# Patient Record
Sex: Female | Born: 1965 | Race: White | Hispanic: No | Smoking: Former smoker
Health system: Southern US, Community
[De-identification: ages and names within clinical notes are randomized; demographics above are authoritative.]

## PROBLEM LIST (undated history)

## (undated) DIAGNOSIS — F32A Depression, unspecified: Secondary | ICD-10-CM

## (undated) DIAGNOSIS — K219 Gastro-esophageal reflux disease without esophagitis: Secondary | ICD-10-CM

## (undated) DIAGNOSIS — K811 Chronic cholecystitis: Secondary | ICD-10-CM

## (undated) DIAGNOSIS — R51 Headache: Secondary | ICD-10-CM

## (undated) DIAGNOSIS — R519 Headache, unspecified: Secondary | ICD-10-CM

## (undated) DIAGNOSIS — F329 Major depressive disorder, single episode, unspecified: Secondary | ICD-10-CM

## (undated) HISTORY — DX: Chronic cholecystitis: K81.1

## (undated) HISTORY — PX: OTHER SURGICAL HISTORY: SHX169

---

## 2011-12-02 DIAGNOSIS — E669 Obesity, unspecified: Secondary | ICD-10-CM | POA: Insufficient documentation

## 2012-05-09 ENCOUNTER — Ambulatory Visit: Payer: Self-pay | Admitting: Family Medicine

## 2012-07-11 DIAGNOSIS — N926 Irregular menstruation, unspecified: Secondary | ICD-10-CM | POA: Insufficient documentation

## 2018-01-23 ENCOUNTER — Emergency Department
Admission: EM | Admit: 2018-01-23 | Discharge: 2018-01-23 | Disposition: A | Discharge status: Home / Self Care | Payer: 59 | Attending: Emergency Medicine | Admitting: Emergency Medicine

## 2018-01-23 ENCOUNTER — Encounter: Payer: Self-pay | Admitting: Emergency Medicine

## 2018-01-23 ENCOUNTER — Emergency Department: Payer: 59

## 2018-01-23 ENCOUNTER — Other Ambulatory Visit: Payer: Self-pay

## 2018-01-23 DIAGNOSIS — K8020 Calculus of gallbladder without cholecystitis without obstruction: Secondary | ICD-10-CM | POA: Diagnosis not present

## 2018-01-23 DIAGNOSIS — R1011 Right upper quadrant pain: Secondary | ICD-10-CM | POA: Insufficient documentation

## 2018-01-23 DIAGNOSIS — R079 Chest pain, unspecified: Secondary | ICD-10-CM | POA: Diagnosis present

## 2018-01-23 DIAGNOSIS — M546 Pain in thoracic spine: Secondary | ICD-10-CM | POA: Diagnosis not present

## 2018-01-23 DIAGNOSIS — K8050 Calculus of bile duct without cholangitis or cholecystitis without obstruction: Secondary | ICD-10-CM

## 2018-01-23 DIAGNOSIS — R52 Pain, unspecified: Secondary | ICD-10-CM

## 2018-01-23 HISTORY — DX: Major depressive disorder, single episode, unspecified: F32.9

## 2018-01-23 HISTORY — DX: Depression, unspecified: F32.A

## 2018-01-23 LAB — URINALYSIS, COMPLETE (UACMP) WITH MICROSCOPIC
BILIRUBIN URINE: NEGATIVE
Glucose, UA: NEGATIVE mg/dL
HGB URINE DIPSTICK: NEGATIVE
Ketones, ur: NEGATIVE mg/dL
LEUKOCYTES UA: NEGATIVE
NITRITE: NEGATIVE
Protein, ur: NEGATIVE mg/dL
Specific Gravity, Urine: 1.026 (ref 1.005–1.030)
pH: 7 (ref 5.0–8.0)

## 2018-01-23 LAB — BASIC METABOLIC PANEL
Anion gap: 9 (ref 5–15)
BUN: 15 mg/dL (ref 6–20)
CHLORIDE: 105 mmol/L (ref 101–111)
CO2: 25 mmol/L (ref 22–32)
Calcium: 9 mg/dL (ref 8.9–10.3)
Creatinine, Ser: 0.73 mg/dL (ref 0.44–1.00)
Glucose, Bld: 119 mg/dL — ABNORMAL HIGH (ref 65–99)
POTASSIUM: 4.3 mmol/L (ref 3.5–5.1)
SODIUM: 139 mmol/L (ref 135–145)

## 2018-01-23 LAB — CBC
HEMATOCRIT: 38.5 % (ref 35.0–47.0)
Hemoglobin: 13.2 g/dL (ref 12.0–16.0)
MCH: 31.7 pg (ref 26.0–34.0)
MCHC: 34.3 g/dL (ref 32.0–36.0)
MCV: 92.6 fL (ref 80.0–100.0)
PLATELETS: 330 10*3/uL (ref 150–440)
RBC: 4.16 MIL/uL (ref 3.80–5.20)
RDW: 12.5 % (ref 11.5–14.5)
WBC: 9.9 10*3/uL (ref 3.6–11.0)

## 2018-01-23 LAB — HEPATIC FUNCTION PANEL
ALT: 28 U/L (ref 14–54)
AST: 28 U/L (ref 15–41)
Albumin: 4.1 g/dL (ref 3.5–5.0)
Alkaline Phosphatase: 78 U/L (ref 38–126)
Total Bilirubin: 0.4 mg/dL (ref 0.3–1.2)
Total Protein: 7.6 g/dL (ref 6.5–8.1)

## 2018-01-23 LAB — TROPONIN I: Troponin I: <0.03 ng/mL (ref <0.03)

## 2018-01-23 LAB — LIPASE, BLOOD: Lipase: 39 U/L (ref 11–51)

## 2018-01-23 MED ORDER — IBUPROFEN 600 MG PO TABS: 600.0000 mg | ORAL_TABLET | Freq: Three times a day (TID) | ORAL | 0 refills | Status: DC | PRN

## 2018-01-23 MED ORDER — KETOROLAC TROMETHAMINE 30 MG/ML IJ SOLN
15.0000 mg | Freq: Once | INTRAMUSCULAR | Status: AC
Start: 2018-01-23 — End: 2018-01-23
  Administered 2018-01-23: 15 mg via INTRAVENOUS
  Filled 2018-01-23: qty 1

## 2018-01-23 MED ORDER — FENTANYL CITRATE (PF) 100 MCG/2ML IJ SOLN
50.0000 ug | Freq: Once | INTRAMUSCULAR | Status: AC
  Administered 2018-01-23: 50 ug via INTRAVENOUS
  Filled 2018-01-23: qty 2

## 2018-01-23 MED ORDER — HYDROCODONE-ACETAMINOPHEN 5-325 MG PO TABS: 1.0000 | ORAL_TABLET | Freq: Four times a day (QID) | ORAL | 0 refills | Status: DC | PRN

## 2018-01-23 MED ORDER — ONDANSETRON HCL 4 MG/2ML IJ SOLN
4.0000 mg | Freq: Once | INTRAMUSCULAR | Status: AC
Start: 2018-01-23 — End: 2018-01-23
  Administered 2018-01-23: 4 mg via INTRAVENOUS
  Filled 2018-01-23: qty 2

## 2018-01-23 MED ORDER — IOHEXOL 350 MG/ML SOLN
75.0000 mL | Freq: Once | INTRAVENOUS | Status: AC | PRN
  Administered 2018-01-23: 75 mL via INTRAVENOUS

## 2018-01-23 MED ORDER — ONDANSETRON 4 MG PO TBDP: 4.0000 mg | ORAL_TABLET | Freq: Three times a day (TID) | ORAL | 0 refills | Status: DC | PRN

## 2018-01-23 NOTE — ED Notes (Signed)
Patient transported to CT 

## 2018-01-23 NOTE — ED Triage Notes (Signed)
Pt arrives POV and ambulatory to triage with c/o chest pain that "shoots through" to her back. Pt reports that the pain started around 2300. Pt is in NAD.

## 2018-01-23 NOTE — ED Notes (Signed)
Patient transported to Ultrasound 

## 2018-01-23 NOTE — Discharge Instructions (Signed)
Please take your pain medication as needed for severe symptoms and make an appointment to follow-up with the general surgeon for reevaluation.  Return to the emergency department sooner for any concerns whatsoever.

## 2018-01-23 NOTE — ED Provider Notes (Signed)
[facility] Emergency Department Provider Note  ____________________________________________   First MD Initiated Contact with Patient 01/23/18 0510     (approximate)  I have reviewed the triage vital signs and the nursing notes.   HISTORY  Chief Complaint Chest Pain   HPI Norma Anderson is a 52 y.o. female who comes to the emergency department with right upper back pain wrapping around to her right upper quadrant and right lower chest that began last night around 11 PM.  She was getting ready for bed when it came on gradually and has been completely constant ever since.  Associated with nausea and she has vomited several times.  It is nonexertional.  Non-positional.  She did eat a quesadilla shortly before going to bed.  No history of similar pain.  No history of coronary artery disease.  She was recently diagnosed with a left ovarian "mass" and is scheduled for surgical resection.  She carries no known diagnosis of cancer.  Nothing seems to make the pain better or worse.  It is currently moderate in severity and associated with nausea.    Past Medical History:  Diagnosis Date  . Depression     There are no active problems to display for this patient.   Past Surgical History:  Procedure Laterality Date  . CESAREAN SECTION      Prior to Admission medications   Medication Sig Start Date End Date Taking? Authorizing Provider  HYDROcodone-acetaminophen (NORCO) 5-325 MG tablet Take 1 tablet by mouth every 6 (six) hours as needed for up to 7 doses for severe pain. 01/23/18   Merrily Brittle, MD  ibuprofen (ADVIL,MOTRIN) 600 MG tablet Take 1 tablet (600 mg total) by mouth every 8 (eight) hours as needed. 01/23/18   Merrily Brittle, MD  ondansetron (ZOFRAN ODT) 4 MG disintegrating tablet Take 1 tablet (4 mg total) by mouth every 8 (eight) hours as needed for nausea or vomiting. 01/23/18   Merrily Brittle, MD    Allergies Patient has no known allergies.  No  family history on file.  Social History Social History   Tobacco Use  . Smoking status: Never Smoker  . Smokeless tobacco: Never Used  Substance Use Topics  . Alcohol use: Never    Frequency: Never  . Drug use: Never    Review of Systems Constitutional: No fever/chills Eyes: No visual changes. ENT: No sore throat. Cardiovascular: Positive for chest pain. Respiratory: Denies shortness of breath. Gastrointestinal: Positive for abdominal pain.  Positive for nausea, positive for vomiting.  No diarrhea.  No constipation. Genitourinary: Negative for dysuria. Musculoskeletal: Negative for back pain. Skin: Negative for rash. Neurological: Negative for headaches, focal weakness or numbness.   ____________________________________________   PHYSICAL EXAM:  VITAL SIGNS: ED Triage Vitals  Enc Vitals Group     BP 01/23/18 0428 (!) 174/84     Pulse Rate 01/23/18 0428 71     Resp 01/23/18 0428 16     Temp 01/23/18 0428 98.5 F (36.9 C)     Temp Source 01/23/18 0428 Oral     SpO2 01/23/18 0428 97 %     Weight 01/23/18 0425 295 lb (133.8 kg)     Height 01/23/18 0425 5\' 6"  (1.676 m)     Head Circumference --      Peak Flow --      Pain Score --      Pain Loc --      Pain Edu? --      Excl. in GC? --  Constitutional: Alert and oriented x4 pleasant cooperative speaks full clear sentences no diaphoresis Eyes: PERRL EOMI. Head: Atraumatic. Nose: No congestion/rhinnorhea. Mouth/Throat: No trismus Neck: No stridor.   Cardiovascular: Normal rate, regular rhythm. Grossly normal heart sounds.  Good peripheral circulation. Respiratory: Normal respiratory effort.  No retractions. Lungs CTAB and moving good air Gastrointestinal: Morbidly obese soft nondistended nontender no rebound or guarding no peritonitis no McBurney's tenderness negative Rovsing's negative Murphy's Musculoskeletal: No lower extremity edema   Neurologic:  Normal speech and language. No gross focal neurologic  deficits are appreciated. Skin:  Skin is warm, dry and intact. No rash noted. Psychiatric: Mood and affect are normal. Speech and behavior are normal.    ____________________________________________   DIFFERENTIAL includes but not limited to  Aortic dissection, pulmonary embolism, acute coronary syndrome, biliary colic, cholecystitis ____________________________________________   LABS (all labs ordered are listed, but only abnormal results are displayed)  Labs Reviewed  BASIC METABOLIC PANEL - Abnormal; Notable for the following components:      Result Value   Glucose, Bld 119 (*)    All other components within normal limits  URINALYSIS, COMPLETE (UACMP) WITH MICROSCOPIC - Abnormal; Notable for the following components:   Color, Urine YELLOW (*)    APPearance CLEAR (*)    Bacteria, UA RARE (*)    All other components within normal limits  HEPATIC FUNCTION PANEL - Abnormal; Notable for the following components:   Bilirubin, Direct <0.1 (*)    All other components within normal limits  CBC  TROPONIN I  LIPASE, BLOOD  POC URINE PREG, ED    Lab work reviewed by me with no signs of acute ischemia No signs of biliary obstruction __________________________________________  EKG  ED ECG REPORT I, Merrily BrittleNeil Toryn Mcclinton, the attending physician, personally viewed and interpreted this ECG.  Date: 01/23/2018 EKG Time:  Rate: 68 Rhythm: normal sinus rhythm QRS Axis: Leftward axis Intervals: normal ST/T Wave abnormalities: normal Narrative Interpretation: no evidence of acute ischemia  ____________________________________________  RADIOLOGY  Chest x-ray reviewed by me with no acute disease CT angios reviewed by me with no clot but does show gallstones ___Ultrasound of the right upper quadrant reviewed by me with no evidence of biliary dilation or cholecystitis _________________________________________   PROCEDURES  Procedure(s) performed: no  Procedures  Critical Care  performed: no  ____________________________________________   INITIAL IMPRESSION / ASSESSMENT AND PLAN / ED COURSE  Pertinent labs & imaging results that were available during my care of the patient were reviewed by me and considered in my medical decision making (see chart for details).       ----------------------------------------- 5:53 AM on 01/23/2018 -----------------------------------------  The patient's CT angiogram reviewed by me shows no obvious clot however I do see 3 very large gallstones which could be consistent with the patient's symptoms.  50 mcg of IV fentanyl, 15 mg of IV Toradol, and 4 mg of IV Zofran for pain control now along with a right upper quadrant ultrasound are all pending.  Clinically she has biliary colic and not cholecystitis.  Also added on LFTs and lipase.  ____________________________________________  ----------------------------------------- 6:39 AM on 01/23/2018 -----------------------------------------  Fortunately the patient's LFTs and lipase are reassuring and her right upper quadrant ultrasound has no evidence of cholecystitis.  Following Toradol and fentanyl her pain is now down to a 0.  I will make an outpatient referral to general surgery and treat her symptomatically with a short course of ibuprofen, Zofran, and Vicodin.  Strict return precautions have been given and  the patient verbalizes understanding and agreement with plan.  FINAL CLINICAL IMPRESSION(S) / ED DIAGNOSES  Final diagnoses:  Pain  Biliary colic      NEW MEDICATIONS STARTED DURING THIS VISIT:  New Prescriptions   HYDROCODONE-ACETAMINOPHEN (NORCO) 5-325 MG TABLET    Take 1 tablet by mouth every 6 (six) hours as needed for up to 7 doses for severe pain.   IBUPROFEN (ADVIL,MOTRIN) 600 MG TABLET    Take 1 tablet (600 mg total) by mouth every 8 (eight) hours as needed.   ONDANSETRON (ZOFRAN ODT) 4 MG DISINTEGRATING TABLET    Take 1 tablet (4 mg total) by mouth every 8  (eight) hours as needed for nausea or vomiting.     Note:  This document was prepared using Dragon voice recognition software and may include unintentional dictation errors.     Merrily Brittle, MD 01/23/18 [phone]

## 2018-02-08 DIAGNOSIS — N838 Other noninflammatory disorders of ovary, fallopian tube and broad ligament: Secondary | ICD-10-CM | POA: Insufficient documentation

## 2018-02-09 ENCOUNTER — Ambulatory Visit: Payer: Managed Care, Other (non HMO) | Admitting: Surgery

## 2018-02-09 ENCOUNTER — Encounter: Payer: Self-pay | Admitting: Surgery

## 2018-02-09 VITALS — BP 136/74 | HR 74 | Temp 98.1°F | Ht 66.0 in | Wt 285.0 lb

## 2018-02-09 DIAGNOSIS — K8020 Calculus of gallbladder without cholecystitis without obstruction: Secondary | ICD-10-CM | POA: Insufficient documentation

## 2018-02-09 DIAGNOSIS — G43909 Migraine, unspecified, not intractable, without status migrainosus: Secondary | ICD-10-CM | POA: Insufficient documentation

## 2018-02-09 DIAGNOSIS — F341 Dysthymic disorder: Secondary | ICD-10-CM | POA: Insufficient documentation

## 2018-02-09 NOTE — Patient Instructions (Signed)
You have requested to have your gallbladder removed. This will be done at [facility] with  Dr. Earlene Plateravis.  We will call you with the date.   You will most likely be out of work 1-2 weeks for this surgery. You will return after your post-op appointment with a lifting restriction for approximately 4 more weeks.  You will be able to eat anything you would like to following surgery. But, start by eating a bland diet and advance this as tolerated. The Gallbladder diet is below, please go as closely by this diet as possible prior to surgery to avoid any further attacks.  Please see the (blue)pre-care form that you have been given today. If you have any questions, please call our office.  Laparoscopic Cholecystectomy Laparoscopic cholecystectomy is surgery to remove the gallbladder. The gallbladder is located in the upper right part of the abdomen, behind the liver. It is a storage sac for bile, which is produced in the liver. Bile aids in the digestion and absorption of fats. Cholecystectomy is often done for inflammation of the gallbladder (cholecystitis). This condition is usually caused by a buildup of gallstones (cholelithiasis) in the gallbladder. Gallstones can block the flow of bile, and that can result in inflammation and pain. In severe cases, emergency surgery may be required. If emergency surgery is not required, you will have time to prepare for the procedure. Laparoscopic surgery is an alternative to open surgery. Laparoscopic surgery has a shorter recovery time. Your common bile duct may also need to be examined during the procedure. If stones are found in the common bile duct, they may be removed. LET [facility]OUR [facility]ARE PROVIDER KNOW ABOUT:  Any allergies you have.  All medicines you are taking, including vitamins, herbs, eye drops, creams, and over-the-counter medicines.  Previous problems you or members of your family have had with the use of anesthetics.  Any blood  disorders you have.  Previous surgeries you have had.    Any medical conditions you have. RISKS AND COMPLICATIONS Generally, this is a safe procedure. However, problems may occur, including:  Infection.  Bleeding.  Allergic reactions to medicines.  Damage to other structures or organs.  A stone remaining in the common bile duct.  A bile leak from the cyst duct that is clipped when your gallbladder is removed.  The need to convert to open surgery, which requires a larger incision in the abdomen. This may be necessary if your surgeon thinks that it is not safe to continue with a laparoscopic procedure. BEFORE THE PROCEDURE  Ask your health care provider about:  Changing or stopping your regular medicines. This is especially important if you are taking diabetes medicines or blood thinners.  Taking medicines such as aspirin and ibuprofen. These medicines can thin your blood. Do not take these medicines before your procedure if your health care provider instructs you not to.  Follow instructions from your health care provider about eating or drinking restrictions.  Let your health care provider know if you develop a cold or an infection before surgery.  Plan to have someone take you home after the procedure.  Ask your health care provider how your surgical site will be marked or identified.  You may be given antibiotic medicine to help prevent infection. PROCEDURE  To reduce your risk of infection:  Your health care team will wash or sanitize their hands.  Your skin will be washed with soap.  An IV tube may be inserted into one of your  veins.  You will be given a medicine to make you fall asleep (general anesthetic).  A breathing tube will be placed in your mouth.  The surgeon will make several small cuts (incisions) in your abdomen.  A thin, lighted tube (laparoscope) that has a tiny camera on the end will be inserted through one of the small incisions. The camera  on the laparoscope will send a picture to a TV screen (monitor) in the operating room. This will give the surgeon a good view inside your abdomen.  A gas will be pumped into your abdomen. This will expand your abdomen to give the surgeon more room to perform the surgery.  Other tools that are needed for the procedure will be inserted through the other incisions. The gallbladder will be removed through one of the incisions.  After your gallbladder has been removed, the incisions will be closed with stitches (sutures), staples, or skin glue.  Your incisions may be covered with a bandage (dressing). The procedure may vary among health care providers and hospitals. AFTER THE PROCEDURE  Your blood pressure, heart rate, breathing rate, and blood oxygen level will be monitored often until the medicines you were given have worn off.  You will be given medicines as needed to control your pain.   This information is not intended to replace advice given to you by your health care provider. Make sure you discuss any questions you have with your health care provider.   Document Released: 07/19/2005 Document Revised: 04/09/2015 Document Reviewed: 02/28/2013 Elsevier Interactive Patient Education 2016 [place] Diet for Gallbladder Conditions A low-fat diet can be helpful if you have pancreatitis or a gallbladder condition. With these conditions, your pancreas and gallbladder have trouble digesting fats. A healthy eating plan with less fat will help rest your pancreas and gallbladder and reduce your symptoms. WHAT DO I NEED TO KNOW ABOUT THIS DIET?  Eat a low-fat diet.  Reduce your fat intake to less than 20-30% of your total daily calories. This is less than 50-60 g of fat per day.  Remember that you need some fat in your diet. Ask your dietician what your daily goal should be.  Choose nonfat and low-fat healthy foods. Look for the words "nonfat," "low fat," or "fat free."  As a  guide, look on the label and choose foods with less than 3 g of fat per serving. Eat only one serving.  Avoid alcohol.  Do not smoke. If you need help quitting, talk with your health care provider.  Eat small frequent meals instead of three large heavy meals. WHAT FOODS CAN I EAT? Grains Include healthy grains and starches such as potatoes, wheat bread, fiber-rich cereal, and brown rice. Choose whole grain options whenever possible. In adults, whole grains should account for 45-65% of your daily calories.  Fruits and Vegetables Eat plenty of fruits and vegetables. Fresh fruits and vegetables add fiber to your diet. Meats and Other Protein Sources Eat lean meat such as chicken and pork. Trim any fat off of meat before cooking it. Eggs, fish, and beans are other sources of protein. In adults, these foods should account for 10-35% of your daily calories. Dairy Choose low-fat milk and dairy options. Dairy includes fat and protein, as well as calcium.  Fats and Oils Limit high-fat foods such as fried foods, sweets, baked goods, sugary drinks.  Other Creamy sauces and condiments, such as mayonnaise, can add extra fat. Think about whether or not you need to  use them, or use smaller amounts or low fat options. WHAT FOODS ARE NOT RECOMMENDED?  High fat foods, such as:  [employer].  Ice cream.  [place] toast.  Sweet rolls.  Pizza.  Cheese bread.  Foods covered with batter, butter, creamy sauces, or cheese.  Fried foods.  Sugary drinks and desserts.  Foods that cause gas or bloating   This information is not intended to replace advice given to you by your health care provider. Make sure you discuss any questions you have with your health care provider.   Document Released: 07/24/2013 Document Reviewed: 07/24/2013 Elsevier Interactive Patient Education [employer].

## 2018-02-12 ENCOUNTER — Encounter: Payer: Self-pay | Admitting: Surgery

## 2018-02-12 NOTE — H&P (View-Only) (Signed)
Surgical Clinic History and Physical  Referring provider:  Rolm GalaGrandis, Heidi, MD [address] [place], [place] [id number]  HISTORY OF PRESENT ILLNESS (HPI):  52 y.o. female presents for evaluation of RUQ abdominal pain. Patient reports she [facility] for RUQ pain with nausea and several episodes of non-bloody emesis after eating a quesadilla for a late dinner. After RUQ abdominal ultrasound was performed, her pain resolved following a dose of Toradol and fentanyl. She has since avoided any fatty foods and denies and additional subsequent episodes of RUQ abdominal pain. She otherwise denies any fever/chills, CP, or SOB.  PAST MEDICAL HISTORY (PMH):  Past Medical History:  Diagnosis Date  . Depression      PAST SURGICAL HISTORY (PSH):  Past Surgical History:  Procedure Laterality Date  . CESAREAN SECTION       MEDICATIONS:  Prior to Admission medications   Medication Sig Start Date End Date Taking? Authorizing Provider  acetaminophen (TYLENOL) 500 MG tablet Take by mouth.    [provider]  buPROPion (WELLBUTRIN XL) 150 MG 24 hr tablet Take 150 mg by mouth daily. 01/09/18   [provider]  Diphenhydramine-APAP 12.5-325 MG/15ML LIQD Take by mouth.    [provider]  ibuprofen (ADVIL,MOTRIN) 600 MG tablet Take 1 tablet (600 mg total) by mouth every 8 (eight) hours as needed. 01/23/18   Merrily Brittleifenbark, Neil, MD  naproxen sodium (ALEVE) 220 MG tablet Reported on 11/17/2015    [provider]  Phenylephrine-Acetaminophen (VICKS SINEX DAYTIME) 5-325 MG CAPS Reported on 11/17/2015    [provider]     ALLERGIES:  No Known Allergies   SOCIAL HISTORY:  Social History   Socioeconomic History  . Marital status: Divorced    Spouse name: Not on file  . Number of children: Not on file  . Years of education: Not on file  . Highest education level: Not on file  Occupational History  . Not on file  Social Needs  . Financial  resource strain: Not on file  . Food insecurity:    Worry: Not on file    Inability: Not on file  . Transportation needs:    Medical: Not on file    Non-medical: Not on file  Tobacco Use  . Smoking status: Never Smoker  . Smokeless tobacco: Never Used  Substance and Sexual Activity  . Alcohol use: Never    Frequency: Never  . Drug use: Never  . Sexual activity: Not on file  Lifestyle  . Physical activity:    Days per week: Not on file    Minutes per session: Not on file  . Stress: Not on file  Relationships  . Social connections:    Talks on phone: Not on file    Gets together: Not on file    Attends religious service: Not on file    Active member of club or organization: Not on file    Attends meetings of clubs or organizations: Not on file    Relationship status: Not on file  . Intimate partner violence:    Fear of current or ex partner: Not on file    Emotionally abused: Not on file    Physically abused: Not on file    Forced sexual activity: Not on file  Other Topics Concern  . Not on file  Social History Narrative  . Not on file    The patient currently resides (home / rehab facility / nursing home): Home The patient  normally is (ambulatory / bedbound): Ambulatory  FAMILY HISTORY:  History reviewed. No pertinent family history.  Otherwise negative/non-contributory.  REVIEW OF SYSTEMS:  Constitutional: denies any other weight loss, fever, chills, or sweats  Eyes: denies any other vision changes, history of eye injury  ENT: denies sore throat, hearing problems  Respiratory: denies shortness of breath, wheezing  Cardiovascular: denies chest pain, palpitations  Gastrointestinal: abdominal pain, N/V, and bowel function as per HPI Musculoskeletal: denies any other joint pains or cramps  Skin: Denies any other rashes or skin discolorations Neurological: denies any other headache, dizziness, weakness  Psychiatric: Denies any other depression, anxiety   All other  review of systems were otherwise negative   VITAL SIGNS:  @VSRANGES @     Height: 5\' 6"  (167.6 cm) Weight: 285 lb (129.3 kg) BMI (Calculated): 46.02   PHYSICAL EXAM:  Constitutional:  -- Obese body habitus  -- Awake, alert, and oriented x3  Eyes:  -- Pupils equally round and reactive to light  -- No scleral icterus  Ear, nose, throat:  -- No jugular venous distension -- No nasal drainage, bleeding Pulmonary:  -- No crackles  -- Equal breath sounds bilaterally -- Breathing non-labored at rest Cardiovascular:  -- S1, S2 present  -- No pericardial rubs  Gastrointestinal:  -- Abdomen soft, nontender, non-distended, no guarding/rebound  -- No abdominal masses appreciated, pulsatile or otherwise  Musculoskeletal and Integumentary:  -- Wounds or skin discoloration: None appreciated -- Extremities: B/L UE and LE FROM, hands and feet warm  Neurologic:  -- Motor function: Intact and symmetric -- Sensation: Intact and symmetric  Labs:  CBC Latest Ref Rng & Units 01/23/2018  WBC 3.6 - 11.0 K/uL 9.9  Hemoglobin 12.0 - 16.0 g/dL 09.8  Hematocrit 11.9 - 47.0 % 38.5  Platelets 150 - 440 K/uL 330   CMP Latest Ref Rng & Units 01/23/2018  Glucose 65 - 99 mg/dL 147(W)  BUN 6 - 20 mg/dL 15  Creatinine 2.95 - 6.21 mg/dL 3.08  Sodium 657 - 846 mmol/L 139  Potassium 3.5 - 5.1 mmol/L 4.3  Chloride 101 - 111 mmol/L 105  CO2 22 - 32 mmol/L 25  Calcium 8.9 - 10.3 mg/dL 9.0  Total Protein 6.5 - 8.1 g/dL 7.6  Total Bilirubin 0.3 - 1.2 mg/dL 0.4  Alkaline Phos 38 - 126 U/L 78  AST 15 - 41 U/L 28  ALT 14 - 54 U/L 28    Imaging studies:  Limited RUQ Abdominal Ultrasound (01/23/2018) 1. Cholelithiasis with no additional sonographic features for acute cholecystitis. 2. No biliary dilatation.   Assessment/Plan:  52 y.o. female with symptomatic cholelithiasis, complicated by co-morbidities including morbid obesity (BMI >46) and major depression disorder.              - avoid/minimize foods  with higher fat content (meats, cheeses/dairy, and fried)             - prefer low-fat vegetables, whole grains (wheat bread, ceareals, etc), and fruits until cholecystectomy              - all risks, benefits, and alternatives to cholecystectomy were discussed with the patient, all of her questions were answered to her expressed satisfaction, patient expresses she wishes to proceed, and informed consent was obtained.             - will plan for laparoscopic cholecystectomy on 8/2 pending anesthesia and OR availability             - anticipate return to  clinic 2 weeks after above planned surgery             - instructed to call if any questions or concerns  All of the above recommendations were discussed with the patient, and all of patient's questions were answered to her expressed satisfaction.  Thank you for the opportunity to participate in this patient's care.  -- Scherrie Gerlach Earlene Plater, MD, RPVI [place]: [facility] General Surgery - Partnering for exceptional care. Office: [phone]

## 2018-02-12 NOTE — Progress Notes (Signed)
Surgical Clinic History and Physical  Referring provider:  Grandis, Heidi, MD [address] [place] [address] [place], [place] [address]  HISTORY OF PRESENT ILLNESS (HPI):  52 y.o. female presents for evaluation of RUQ abdominal pain. Patient reports she presented 1 week ago to [facility] ED for RUQ pain with nausea and several episodes of non-bloody emesis after eating a quesadilla for a late dinner. After RUQ abdominal ultrasound was performed, her pain resolved following a dose of Toradol and fentanyl. She has since avoided any fatty foods and denies and additional subsequent episodes of RUQ abdominal pain. She otherwise denies any fever/chills, CP, or SOB.  PAST MEDICAL HISTORY (PMH):  Past Medical History:  Diagnosis Date  . Depression      PAST SURGICAL HISTORY (PSH):  Past Surgical History:  Procedure Laterality Date  . CESAREAN SECTION       MEDICATIONS:  Prior to Admission medications   Medication Sig Start Date End Date Taking? Authorizing Provider  acetaminophen (TYLENOL) 500 MG tablet Take by mouth.    [provider]  buPROPion (WELLBUTRIN XL) 150 MG 24 hr tablet Take 150 mg by mouth daily. 01/09/18   [provider]  Diphenhydramine-APAP 12.5-325 MG/15ML LIQD Take by mouth.    [provider]  ibuprofen (ADVIL,MOTRIN) 600 MG tablet Take 1 tablet (600 mg total) by mouth every 8 (eight) hours as needed. 01/23/18   Rifenbark, Neil, MD  naproxen sodium (ALEVE) 220 MG tablet Reported on 11/17/2015    [provider]  Phenylephrine-Acetaminophen (VICKS SINEX DAYTIME) 5-325 MG CAPS Reported on 11/17/2015    [provider]     ALLERGIES:  No Known Allergies   SOCIAL HISTORY:  Social History   Socioeconomic History  . Marital status: Divorced    Spouse name: Not on file  . Number of children: Not on file  . Years of education: Not on file  . Highest education level: Not on file  Occupational History  . Not on file  Social Needs  . Financial  resource strain: Not on file  . Food insecurity:    Worry: Not on file    Inability: Not on file  . Transportation needs:    Medical: Not on file    Non-medical: Not on file  Tobacco Use  . Smoking status: Never Smoker  . Smokeless tobacco: Never Used  Substance and Sexual Activity  . Alcohol use: Never    Frequency: Never  . Drug use: Never  . Sexual activity: Not on file  Lifestyle  . Physical activity:    Days per week: Not on file    Minutes per session: Not on file  . Stress: Not on file  Relationships  . Social connections:    Talks on phone: Not on file    Gets together: Not on file    Attends religious service: Not on file    Active member of club or organization: Not on file    Attends meetings of clubs or organizations: Not on file    Relationship status: Not on file  . Intimate partner violence:    Fear of current or ex partner: Not on file    Emotionally abused: Not on file    Physically abused: Not on file    Forced sexual activity: Not on file  Other Topics Concern  . Not on file  Social History Narrative  . Not on file    The patient currently resides (home / rehab facility / nursing home): Home The patient   normally is (ambulatory / bedbound): Ambulatory  FAMILY HISTORY:  History reviewed. No pertinent family history.  Otherwise negative/non-contributory.  REVIEW OF SYSTEMS:  Constitutional: denies any other weight loss, fever, chills, or sweats  Eyes: denies any other vision changes, history of eye injury  ENT: denies sore throat, hearing problems  Respiratory: denies shortness of breath, wheezing  Cardiovascular: denies chest pain, palpitations  Gastrointestinal: abdominal pain, N/V, and bowel function as per HPI Musculoskeletal: denies any other joint pains or cramps  Skin: Denies any other rashes or skin discolorations Neurological: denies any other headache, dizziness, weakness  Psychiatric: Denies any other depression, anxiety   All other  review of systems were otherwise negative   VITAL SIGNS:  @VSRANGES@     Height: 5' 6" (167.6 cm) Weight: 285 lb (129.3 kg) BMI (Calculated): 46.02   PHYSICAL EXAM:  Constitutional:  -- Obese body habitus  -- Awake, alert, and oriented x3  Eyes:  -- Pupils equally round and reactive to light  -- No scleral icterus  Ear, nose, throat:  -- No jugular venous distension -- No nasal drainage, bleeding Pulmonary:  -- No crackles  -- Equal breath sounds bilaterally -- Breathing non-labored at rest Cardiovascular:  -- S1, S2 present  -- No pericardial rubs  Gastrointestinal:  -- Abdomen soft, nontender, non-distended, no guarding/rebound  -- No abdominal masses appreciated, pulsatile or otherwise  Musculoskeletal and Integumentary:  -- Wounds or skin discoloration: None appreciated -- Extremities: B/L UE and LE FROM, hands and feet warm  Neurologic:  -- Motor function: Intact and symmetric -- Sensation: Intact and symmetric  Labs:  CBC Latest Ref Rng & Units 01/23/2018  WBC 3.6 - 11.0 K/uL 9.9  Hemoglobin 12.0 - 16.0 g/dL 13.2  Hematocrit 35.0 - 47.0 % 38.5  Platelets 150 - 440 K/uL 330   CMP Latest Ref Rng & Units 01/23/2018  Glucose 65 - 99 mg/dL 119(H)  BUN 6 - 20 mg/dL 15  Creatinine 0.44 - 1.00 mg/dL 0.73  Sodium 135 - 145 mmol/L 139  Potassium 3.5 - 5.1 mmol/L 4.3  Chloride 101 - 111 mmol/L 105  CO2 22 - 32 mmol/L 25  Calcium 8.9 - 10.3 mg/dL 9.0  Total Protein 6.5 - 8.1 g/dL 7.6  Total Bilirubin 0.3 - 1.2 mg/dL 0.4  Alkaline Phos 38 - 126 U/L 78  AST 15 - 41 U/L 28  ALT 14 - 54 U/L 28    Imaging studies:  Limited RUQ Abdominal Ultrasound (01/23/2018) 1. Cholelithiasis with no additional sonographic features for acute cholecystitis. 2. No biliary dilatation.   Assessment/Plan:  52 y.o. female with symptomatic cholelithiasis, complicated by co-morbidities including morbid obesity (BMI >46) and major depression disorder.              - avoid/minimize foods  with higher fat content (meats, cheeses/dairy, and fried)             - prefer low-fat vegetables, whole grains (wheat bread, ceareals, etc), and fruits until cholecystectomy              - all risks, benefits, and alternatives to cholecystectomy were discussed with the patient, all of her questions were answered to her expressed satisfaction, patient expresses she wishes to proceed, and informed consent was obtained.             - will plan for laparoscopic cholecystectomy on 8/2 pending anesthesia and OR availability             - anticipate return to   clinic 2 weeks after above planned surgery             - instructed to call if any questions or concerns  All of the above recommendations were discussed with the patient, and all of patient's questions were answered to her expressed satisfaction.  Thank you for the opportunity to participate in this patient's care.  -- Atzin Buchta E. Shakira Los, MD, RPVI [place]:  [facility] Partnering for exceptional care. Office: [phone] 

## 2018-02-14 ENCOUNTER — Telehealth: Payer: Self-pay | Admitting: Surgery

## 2018-02-14 NOTE — Telephone Encounter (Signed)
Pt advised of pre op date/time and sx date. Sx: 03/03/18 with Dr Davis-laparoscopic cholecystectomy.  Pre op: 02/24/18 between 1-5:00pm--phone interview.   Patient made aware to call [phone], between 1-3:00pm the day before surgery, to find out what time to arrive.

## 2018-02-14 NOTE — Telephone Encounter (Signed)
Received patients fmla paperwork. Payment has been paid and placed in folder up front.

## 2018-02-15 ENCOUNTER — Telehealth: Payer: Self-pay

## 2018-02-15 NOTE — Telephone Encounter (Signed)
Patient's FMLA form was filled out and [facility] [phone] per patient's request.

## 2018-02-24 ENCOUNTER — Encounter
Admission: RE | Admit: 2018-02-24 | Discharge: 2018-02-24 | Disposition: A | Discharge status: Home / Self Care | Payer: Managed Care, Other (non HMO) | Source: Ambulatory Visit | Attending: Surgery | Admitting: Surgery

## 2018-02-24 ENCOUNTER — Other Ambulatory Visit: Payer: Self-pay

## 2018-02-24 HISTORY — DX: Headache, unspecified: R51.9

## 2018-02-24 HISTORY — DX: Headache: R51

## 2018-02-24 HISTORY — DX: Gastro-esophageal reflux disease without esophagitis: K21.9

## 2018-02-24 NOTE — Patient Instructions (Addendum)
Your procedure is scheduled on: 03-03-18 Report to Same Day Surgery 2nd floor medical mall [facility] Entrance-take elevator on left to 2nd floor.  Check in with surgery information desk.) To find out your arrival time please call [phone] between 1PM - 3PM on 03-02-18  Remember: Instructions that are not followed completely may result in serious medical risk, up to and including death, or upon the discretion of your surgeon and anesthesiologist your surgery may need to be rescheduled.    _x___ 1. Do not eat food after midnight the night before your procedure. You may drink clear liquids up to 2 hours before you are scheduled to arrive at the hospital for your procedure.  Do not drink clear liquids within 2 hours of your scheduled arrival to the hospital.  Clear liquids include  --Water or Apple juice without pulp  --Clear carbohydrate beverage such as ClearFast or Gatorade  --Black Coffee or Clear Tea (No milk, no creamers, do not add anything to the coffee or Tea   No gum chewing or hard candies.     __x__ 2. No Alcohol for 24 hours before or after surgery.   __x__3. No Smoking or e-cigarettes for 24 prior to surgery.  Do not use any chewable tobacco products for at least 6 hour prior to surgery   ____  4. Bring all medications with you on the day of surgery if instructed.    __x__ 5. Notify your doctor if there is any change in your medical condition     (cold, fever, infections).    x___6. On the morning of surgery brush your teeth with toothpaste and water.  You may rinse your mouth with mouth wash if you wish.  Do not swallow any toothpaste or mouthwash.   Do not wear jewelry, make-up, hairpins, clips or nail polish.  Do not wear lotions, powders, or perfumes. You may wear deodorant.  Do not shave 48 hours prior to surgery. Men may shave face and neck.  Do not bring valuables to the hospital.    [facility] is not responsible for any belongings or valuables.    Contacts, dentures or bridgework may not be worn into surgery.  Leave your suitcase in the car. After surgery it may be brought to your room.  For patients admitted to the hospital, discharge time is determined by your treatment team.  _  Patients discharged the day of surgery will not be allowed to drive home.  You will need someone to drive you home and stay with you the night of your procedure.    Please read over the following fact sheets that you were given:   [facility] and or MRSA Information   ____ Take anti-hypertensive listed below, cardiac, seizure, asthma,  anti-reflux and psychiatric medicines. These include:  1. NONE  2.  3.  4.  5.  6.  ____Fleets enema or Magnesium Citrate as directed.   ____ Use CHG Soap or sage wipes as directed on instruction sheet   ____ Use inhalers on the day of surgery and bring to hospital day of surgery  ____ Stop Metformin and Janumet 2 days prior to surgery.    ____ Take 1/2 of usual insulin dose the night before surgery and none on the morning surgery.   ____ Follow recommendations from Cardiologist, Pulmonologist or PCP regarding stopping Aspirin, Coumadin, Plavix ,Eliquis, Effient, or Pradaxa, and Pletal.  X____Stop Anti-inflammatories such as Advil, Aleve, Ibuprofen, Motrin, Naproxen, Naprosyn, Goodies powders or  aspirin products NOW-OK to take Tylenol    ____ Stop supplements until after surgery.    ____ Bring C-Pap to the hospital.

## 2018-02-24 NOTE — Pre-Procedure Instructions (Signed)
EKG  ED ECG REPORT I, Merrily BrittleNeil Rifenbark, the attending physician, personally viewed and interpreted this ECG.  Date: 01/23/2018 EKG Time:  Rate: 68 Rhythm: normal sinus rhythm QRS Axis: Leftward axis Intervals: normal ST/T Wave abnormalities: normal Narrative Interpretation: no evidence of acute ischemia  ____________________________________________  RADIOLOGY  Chest x-ray reviewed by me with no acute disease CT angios reviewed by me with no clot but does show gallstones ___Ultrasound of the right upper quadrant reviewed by me with no evidence of biliary dilation or cholecystitis _________________________________________   PROCEDURES  Procedure(s) performed: no  Procedures  Critical Care performed: no  ____________________________________________   INITIAL IMPRESSION / ASSESSMENT AND PLAN / ED COURSE  Pertinent labs & imaging results that were available during my care of the patient were reviewed by me and considered in my medical decision making (see chart for details).    ----------------------------------------- 5:53 AM on 01/23/2018 -----------------------------------------  The patient's CT angiogram reviewed by me shows no obvious clot however I do see 3 very large gallstones which could be consistent with the patient's symptoms.  50 mcg of IV fentanyl, 15 mg of IV Toradol, and 4 mg of IV Zofran for pain control now along with a right upper quadrant ultrasound are all pending.  Clinically she has biliary colic and not cholecystitis.  Also added on LFTs and lipase.  ____________________________________________  ----------------------------------------- 6:39 AM on 01/23/2018 -----------------------------------------  Fortunately the patient's LFTs and lipase are reassuring and her right upper quadrant ultrasound has no evidence of cholecystitis.  Following Toradol and fentanyl her pain is now down to a 0.  I will make an outpatient referral to  general surgery and treat her symptomatically with a short course of ibuprofen, Zofran, and Vicodin.  Strict return precautions have been given and the patient verbalizes understanding and agreement with plan.  FINAL CLINICAL IMPRESSION(S) / ED DIAGNOSES  Final diagnoses:  Pain  Biliary colic      NEW MEDICATIONS STARTED DURING THIS VISIT:      New Prescriptions   HYDROCODONE-ACETAMINOPHEN (NORCO) 5-325 MG TABLET    Take 1 tablet by mouth every 6 (six) hours as needed for up to 7 doses for severe pain.   IBUPROFEN (ADVIL,MOTRIN) 600 MG TABLET    Take 1 tablet (600 mg total) by mouth every 8 (eight) hours as needed.   ONDANSETRON (ZOFRAN ODT) 4 MG DISINTEGRATING TABLET    Take 1 tablet (4 mg total) by mouth every 8 (eight) hours as needed for nausea or vomiting.     Note:  This document was prepared using Dragon voice recognition software and may include unintentional dictation errors.     Merrily Brittleifenbark, Neil, MD 01/23/18 [phone]           Electronically signed by Merrily Brittleifenbark, Neil, MD at 01/23/2018 6:40 AM     ED on 01/23/2018        Detailed Report

## 2018-03-02 MED ORDER — DEXTROSE 5 % IV SOLN
3.0000 g | INTRAVENOUS | Status: AC
  Administered 2018-03-03: 3 g via INTRAVENOUS
  Filled 2018-03-02: qty 3

## 2018-03-03 ENCOUNTER — Ambulatory Visit: Payer: Managed Care, Other (non HMO) | Admitting: Certified Registered Nurse Anesthetist

## 2018-03-03 ENCOUNTER — Ambulatory Visit
Admission: RE | Admit: 2018-03-03 | Discharge: 2018-03-03 | Disposition: A | Discharge status: Home / Self Care | Payer: Managed Care, Other (non HMO) | Source: Ambulatory Visit | Attending: Surgery | Admitting: Surgery

## 2018-03-03 ENCOUNTER — Encounter: Payer: Self-pay | Admitting: *Deleted

## 2018-03-03 ENCOUNTER — Encounter
Admission: RE | Disposition: A | Discharge status: Home / Self Care | Payer: Self-pay | Source: Ambulatory Visit | Attending: Surgery

## 2018-03-03 DIAGNOSIS — Z79899 Other long term (current) drug therapy: Secondary | ICD-10-CM | POA: Insufficient documentation

## 2018-03-03 DIAGNOSIS — Z87891 Personal history of nicotine dependence: Secondary | ICD-10-CM | POA: Diagnosis not present

## 2018-03-03 DIAGNOSIS — F329 Major depressive disorder, single episode, unspecified: Secondary | ICD-10-CM | POA: Diagnosis not present

## 2018-03-03 DIAGNOSIS — K811 Chronic cholecystitis: Secondary | ICD-10-CM

## 2018-03-03 DIAGNOSIS — K8064 Calculus of gallbladder and bile duct with chronic cholecystitis without obstruction: Secondary | ICD-10-CM | POA: Diagnosis not present

## 2018-03-03 DIAGNOSIS — Z6841 Body Mass Index (BMI) 40.0 and over, adult: Secondary | ICD-10-CM | POA: Diagnosis not present

## 2018-03-03 DIAGNOSIS — K8020 Calculus of gallbladder without cholecystitis without obstruction: Secondary | ICD-10-CM

## 2018-03-03 HISTORY — PX: CHOLECYSTECTOMY: SHX55

## 2018-03-03 LAB — POCT PREGNANCY, URINE: PREG TEST UR: NEGATIVE

## 2018-03-03 SURGERY — LAPAROSCOPIC CHOLECYSTECTOMY
Anesthesia: General

## 2018-03-03 MED ORDER — OXYCODONE HCL 5 MG/5ML PO SOLN: 5.0000 mg | Freq: Once | ORAL | Status: DC | PRN

## 2018-03-03 MED ORDER — DEXAMETHASONE SODIUM PHOSPHATE 10 MG/ML IJ SOLN
INTRAMUSCULAR | Status: DC | PRN
  Administered 2018-03-03: 10 mg via INTRAVENOUS

## 2018-03-03 MED ORDER — FENTANYL CITRATE (PF) 100 MCG/2ML IJ SOLN
INTRAMUSCULAR | Status: AC
  Administered 2018-03-03: 25 ug via INTRAVENOUS
  Filled 2018-03-03: qty 2

## 2018-03-03 MED ORDER — CHLORHEXIDINE GLUCONATE CLOTH 2 % EX PADS: 6.0000 | MEDICATED_PAD | Freq: Once | CUTANEOUS | Status: DC

## 2018-03-03 MED ORDER — LIDOCAINE HCL (CARDIAC) PF 100 MG/5ML IV SOSY
PREFILLED_SYRINGE | INTRAVENOUS | Status: DC | PRN
  Administered 2018-03-03: 100 mg via INTRAVENOUS

## 2018-03-03 MED ORDER — PROPOFOL 10 MG/ML IV BOLUS
INTRAVENOUS | Status: DC | PRN
  Administered 2018-03-03: 200 mg via INTRAVENOUS

## 2018-03-03 MED ORDER — BUPIVACAINE HCL (PF) 0.5 % IJ SOLN
INTRAMUSCULAR | Status: AC
  Filled 2018-03-03: qty 30

## 2018-03-03 MED ORDER — ONDANSETRON HCL 4 MG/2ML IJ SOLN
INTRAMUSCULAR | Status: DC | PRN
  Administered 2018-03-03: 4 mg via INTRAVENOUS

## 2018-03-03 MED ORDER — FENTANYL CITRATE (PF) 100 MCG/2ML IJ SOLN
INTRAMUSCULAR | Status: DC | PRN
  Administered 2018-03-03: 50 ug via INTRAVENOUS
  Administered 2018-03-03: 100 ug via INTRAVENOUS
  Administered 2018-03-03 (×2): 50 ug via INTRAVENOUS

## 2018-03-03 MED ORDER — ROCURONIUM BROMIDE 100 MG/10ML IV SOLN
INTRAVENOUS | Status: DC | PRN
  Administered 2018-03-03: 50 mg via INTRAVENOUS

## 2018-03-03 MED ORDER — PROMETHAZINE HCL 25 MG/ML IJ SOLN: 6.2500 mg | INTRAMUSCULAR | Status: DC | PRN

## 2018-03-03 MED ORDER — MIDAZOLAM HCL 2 MG/2ML IJ SOLN
INTRAMUSCULAR | Status: DC | PRN
  Administered 2018-03-03: 2 mg via INTRAVENOUS

## 2018-03-03 MED ORDER — KETOROLAC TROMETHAMINE 30 MG/ML IJ SOLN
INTRAMUSCULAR | Status: DC | PRN
  Administered 2018-03-03: 30 mg via INTRAVENOUS

## 2018-03-03 MED ORDER — LACTATED RINGERS IV SOLN
INTRAVENOUS | Status: DC
  Administered 2018-03-03 (×2): via INTRAVENOUS

## 2018-03-03 MED ORDER — LIDOCAINE HCL (PF) 1 % IJ SOLN
INTRAMUSCULAR | Status: AC
  Filled 2018-03-03: qty 30

## 2018-03-03 MED ORDER — ROCURONIUM BROMIDE 100 MG/10ML IV SOLN
INTRAVENOUS | Status: AC
  Filled 2018-03-03: qty 1

## 2018-03-03 MED ORDER — DEXAMETHASONE SODIUM PHOSPHATE 10 MG/ML IJ SOLN
INTRAMUSCULAR | Status: AC
  Filled 2018-03-03: qty 1

## 2018-03-03 MED ORDER — FAMOTIDINE 20 MG PO TABS
ORAL_TABLET | ORAL | Status: AC
  Filled 2018-03-03: qty 1

## 2018-03-03 MED ORDER — SUCCINYLCHOLINE CHLORIDE 20 MG/ML IJ SOLN
INTRAMUSCULAR | Status: DC | PRN
  Administered 2018-03-03: 100 mg via INTRAVENOUS

## 2018-03-03 MED ORDER — FENTANYL CITRATE (PF) 100 MCG/2ML IJ SOLN
25.0000 ug | INTRAMUSCULAR | Status: DC | PRN
  Administered 2018-03-03 (×5): 25 ug via INTRAVENOUS

## 2018-03-03 MED ORDER — LIDOCAINE HCL 1 % IJ SOLN
INTRAMUSCULAR | Status: DC | PRN
  Administered 2018-03-03: 30 mL via SUBCUTANEOUS

## 2018-03-03 MED ORDER — OXYCODONE HCL 5 MG PO TABS: 5.0000 mg | ORAL_TABLET | Freq: Once | ORAL | Status: DC | PRN

## 2018-03-03 MED ORDER — ACETAMINOPHEN 500 MG PO TABS
1000.0000 mg | ORAL_TABLET | ORAL | Status: AC
  Administered 2018-03-03: 1000 mg via ORAL

## 2018-03-03 MED ORDER — ACETAMINOPHEN 500 MG PO TABS
ORAL_TABLET | ORAL | Status: AC
  Filled 2018-03-03: qty 2

## 2018-03-03 MED ORDER — FAMOTIDINE 20 MG PO TABS
20.0000 mg | ORAL_TABLET | Freq: Once | ORAL | Status: AC
  Administered 2018-03-03: 20 mg via ORAL

## 2018-03-03 MED ORDER — ONDANSETRON HCL 4 MG/2ML IJ SOLN
INTRAMUSCULAR | Status: AC
  Filled 2018-03-03: qty 2

## 2018-03-03 MED ORDER — MIDAZOLAM HCL 2 MG/2ML IJ SOLN
INTRAMUSCULAR | Status: AC
  Filled 2018-03-03: qty 2

## 2018-03-03 MED ORDER — SUGAMMADEX SODIUM 200 MG/2ML IV SOLN
INTRAVENOUS | Status: DC | PRN
  Administered 2018-03-03: 200 mg via INTRAVENOUS

## 2018-03-03 MED ORDER — LIDOCAINE HCL (PF) 2 % IJ SOLN
INTRAMUSCULAR | Status: AC
  Filled 2018-03-03: qty 10

## 2018-03-03 MED ORDER — OXYCODONE-ACETAMINOPHEN 5-325 MG PO TABS: 1.0000 | ORAL_TABLET | ORAL | 0 refills | Status: AC | PRN

## 2018-03-03 MED ORDER — MEPERIDINE HCL 50 MG/ML IJ SOLN: 6.2500 mg | INTRAMUSCULAR | Status: DC | PRN

## 2018-03-03 MED ORDER — SUGAMMADEX SODIUM 200 MG/2ML IV SOLN
INTRAVENOUS | Status: AC
  Filled 2018-03-03: qty 2

## 2018-03-03 MED ORDER — ONDANSETRON HCL 4 MG/2ML IJ SOLN
INTRAMUSCULAR | Status: AC
  Administered 2018-03-03: 4 mg
  Filled 2018-03-03: qty 2

## 2018-03-03 MED ORDER — FENTANYL CITRATE (PF) 250 MCG/5ML IJ SOLN
INTRAMUSCULAR | Status: AC
  Filled 2018-03-03: qty 5

## 2018-03-03 MED ORDER — FENTANYL CITRATE (PF) 100 MCG/2ML IJ SOLN
INTRAMUSCULAR | Status: AC
  Filled 2018-03-03: qty 2

## 2018-03-03 MED ORDER — PROPOFOL 10 MG/ML IV BOLUS
INTRAVENOUS | Status: AC
  Filled 2018-03-03: qty 40

## 2018-03-03 MED ORDER — KETOROLAC TROMETHAMINE 30 MG/ML IJ SOLN
INTRAMUSCULAR | Status: AC
  Filled 2018-03-03: qty 1

## 2018-03-03 SURGICAL SUPPLY — 34 items
APPLIER CLIP ROT 10 11.4 M/L (STAPLE) ×3
CHLORAPREP W/TINT 26ML (MISCELLANEOUS) ×3 IMPLANT
CLIP APPLIE ROT 10 11.4 M/L (STAPLE) ×1 IMPLANT
DECANTER SPIKE VIAL GLASS SM (MISCELLANEOUS) IMPLANT
DERMABOND ADVANCED (GAUZE/BANDAGES/DRESSINGS) ×2
DERMABOND ADVANCED .7 DNX12 (GAUZE/BANDAGES/DRESSINGS) ×1 IMPLANT
DRESSING SURGICEL FIBRLLR 1X2 (HEMOSTASIS) IMPLANT
DRSG SURGICEL FIBRILLAR 1X2 (HEMOSTASIS)
ELECT REM PT RETURN 9FT ADLT (ELECTROSURGICAL) ×3
ELECTRODE REM PT RTRN 9FT ADLT (ELECTROSURGICAL) ×1 IMPLANT
GLOVE BIO SURGEON STRL SZ7 (GLOVE) ×3 IMPLANT
GLOVE BIOGEL PI IND STRL 7.5 (GLOVE) ×1 IMPLANT
GLOVE BIOGEL PI INDICATOR 7.5 (GLOVE) ×2
GOWN STRL REUS W/ TWL LRG LVL3 (GOWN DISPOSABLE) ×3 IMPLANT
GOWN STRL REUS W/TWL LRG LVL3 (GOWN DISPOSABLE) ×6
GRASPER SUT TROCAR 14GX15 (MISCELLANEOUS) ×3 IMPLANT
IRRIGATION STRYKERFLOW (MISCELLANEOUS) IMPLANT
IRRIGATOR STRYKERFLOW (MISCELLANEOUS)
IV NS 1000ML (IV SOLUTION) ×2
IV NS 1000ML BAXH (IV SOLUTION) ×1 IMPLANT
KIT TURNOVER KIT A (KITS) ×3 IMPLANT
NEEDLE HYPO 22GX1.5 SAFETY (NEEDLE) ×3 IMPLANT
NEEDLE INSUFFLATION 14GA 120MM (NEEDLE) ×3 IMPLANT
NS IRRIG 1000ML POUR BTL (IV SOLUTION) ×3 IMPLANT
PACK LAP CHOLECYSTECTOMY (MISCELLANEOUS) ×3 IMPLANT
POUCH SPECIMEN RETRIEVAL 10MM (ENDOMECHANICALS) ×3 IMPLANT
SCISSORS METZENBAUM CVD 33 (INSTRUMENTS) IMPLANT
SLEEVE ENDOPATH XCEL 5M (ENDOMECHANICALS) ×6 IMPLANT
SUT MNCRL AB 4-0 PS2 18 (SUTURE) ×3 IMPLANT
SUT VICRYL 0 UR6 27IN ABS (SUTURE) ×3 IMPLANT
SUT VICRYL AB 3-0 FS1 BRD 27IN (SUTURE) ×3 IMPLANT
TROCAR XCEL NON-BLD 11X100MML (ENDOMECHANICALS) ×3 IMPLANT
TROCAR XCEL NON-BLD 5MMX100MML (ENDOMECHANICALS) ×3 IMPLANT
TUBING INSUFFLATION (TUBING) ×3 IMPLANT

## 2018-03-03 NOTE — Discharge Instructions (Addendum)
In addition to included general post-operative instructions for Laparoscopic Cholecystectomy,  Diet: Resume home heart healthy diet (may prefer to start with low fat foods as discussed).   Activity: No heavy lifting >20 pounds (children, pets, laundry, garbage) or strenuous activity until follow-up, but light activity and walking are encouraged. Do not drive or drink alcohol if taking narcotic pain medications.  Wound care: 2 days after surgery (Sunday, 8/4), you may shower/get incision wet with soapy water and pat dry (do not rub incisions), but no baths or submerging incision underwater until follow-up.   Medications: Resume all home medications. For mild to moderate pain: acetaminophen (Tylenol) or ibuprofen/naproxen (if no kidney disease). Combining Tylenol with alcohol can substantially increase your risk of causing liver disease. Narcotic pain medications, if prescribed, can be used for severe pain, though may cause nausea, constipation, and drowsiness. Do not combine Tylenol and Percocet (or similar) within a 6 hour period as Percocet (and similar) contain(s) Tylenol. If you do not need the narcotic pain medication, you do not need to fill the prescription.  Call office [phone]) at any time if any questions, worsening pain, fevers/chills, bleeding, drainage from incision site, or other concerns.  AMBULATORY SURGERY  DISCHARGE INSTRUCTIONS   1) The drugs that you were given will stay in your system until tomorrow so for the next 24 hours you should not:  A) Drive an automobile B) Make any legal decisions C) Drink any alcoholic beverage   2) You may resume regular meals tomorrow.  Today it is better to start with liquids and gradually work up to solid foods.  You may eat anything you prefer, but it is better to start with liquids, then soup and crackers, and gradually work up to solid foods.   3) Please notify your doctor immediately if you have any unusual bleeding, trouble  breathing, redness and pain at the surgery site, drainage, fever, or pain not relieved by medication.    4) Additional Instructions:        Please contact your physician with any problems or Same Day Surgery at [phone], Monday through Friday 6 am to 4 pm, or [place] at [facility] Main number at [phone].

## 2018-03-03 NOTE — Op Note (Signed)
SURGICAL OPERATIVE REPORT   DATE OF PROCEDURE: 03/03/2018  ATTENDING Surgeon(s): Ancil Linseyavis, Jason Evan, MD  ANESTHESIA: GETA  PRE-OPERATIVE DIAGNOSIS: Symptomatic Cholelithiasis (K80.20)  POST-OPERATIVE DIAGNOSIS: Chronic cholecystitis (K81.1)  PROCEDURE(S): (cpt's: 47562) 1.) Laparoscopic Cholecystectomy  INTRAOPERATIVE FINDINGS: Moderately severe pericholecystic inflammation, particularly severe surrounding patient's gallbladder infundibulum and cystic duct with cystic duct and cystic artery clips well-secured, hemostasis at completion of procedure  INTRAOPERATIVE FLUIDS: 1000 mL crystalloid   ESTIMATED BLOOD LOSS: Minimal (<30 mL)   URINE OUTPUT: No foley  SPECIMENS: Gallbladder  IMPLANTS: None  DRAINS: None   COMPLICATIONS: None apparent   CONDITION AT COMPLETION: Hemodynamically stable and extubated  DISPOSITION: PACU   INDICATION(S) FOR PROCEDURE:  Patient is a 52 y.o. female who recently presented with post-prandial RUQ > epigastric abdominal pain after eating fatty foods in particular. Ultrasound suggested cholelithiasis without sonographic evidence of cholecystitis. All risks, benefits, and alternatives to above elective procedures were discussed with the patient, who elected to proceed, and informed consent was accordingly obtained at that time.   DETAILS OF PROCEDURE:  Patient was brought to the operating suite and appropriately identified. General anesthesia was administered along with peri-operative prophylactic IV antibiotics, and endotracheal intubation was performed by anesthesiologist, along with NG/OG tube for gastric decompression. In supine position, operative site was prepped and draped in usual sterile fashion, and following a brief time out, initial 5 mm incision was made in a natural skin crease just above the umbilicus. Fascia was then elevated, and a Verress needle was inserted and its proper position confirmed using aspiration and saline meniscus  test.  Upon insufflation of the abdominal cavity with carbon dioxide to a well-tolerated pressure of 12-15 mmHg, 5 mm peri-umbilical port followed by laparoscope were inserted and used to inspect the abdominal cavity and its contents with no injuries from insertion of the first trochar noted. Three additional trocars were inserted, one at the epigastric position (10 mm) and two along the Right costal margin (5 mm). The table was then placed in reverse Trendelenburg position with the Right side up. Filmy adhesions between the gallbladder and omentum/duodenum/transverse colon were lysed using combined blunt dissection and selective electrocautery. The apex/dome of the gallbladder was grasped with an atraumatic grasper passed through the lateral port and retracted apically over the liver. The infundibulum was also grasped and retracted, exposing Calot's triangle. The peritoneum overlying the gallbladder infundibulum was incised and dissected free of surrounding peritoneal attachments, revealing the cystic duct and cystic artery, which were clipped twice on the patient side and once on the gallbladder specimen side close to the gallbladder. The gallbladder was then dissected from its peritoneal attachments to the liver using electrocautery, and the gallbladder was placed into a laparoscopic specimen bag and removed from the abdominal cavity via the epigastric port site. Hemostasis and secure placement of clips were confirmed, and intra-peritoneal cavity was inspected with no additional findings. PMI laparoscopic fascial closure device was then used to re-approximate fascia at the 10 mm epigastric port site.  All ports were then removed under direct visualization, and abdominal cavity was desuflated. All port sites were irrigated/cleaned, additional local anesthetic was injected at each incision, 3-0 Vicryl was used to re-approximate dermis at 10 mm port site(s), and subcuticular 4-0 Monocryl suture was used to  re-approximate skin. Skin was then cleaned, dried, and sterile skin glue was applied. Patient was then safely able to be awakened, extubated, and transferred to PACU for post-operative monitoring and care.   I was present for all aspects  of the above procedure, and no operative complications were apparent.

## 2018-03-03 NOTE — Anesthesia Postprocedure Evaluation (Signed)
Anesthesia Post Note  Patient: Chip BoerMichelle L Cianci  Procedure(s) Performed: LAPAROSCOPIC CHOLECYSTECTOMY (N/A )  Patient location during evaluation: PACU Anesthesia Type: General Level of consciousness: awake and alert and oriented Pain management: pain level controlled Vital Signs Assessment: post-procedure vital signs reviewed and stable Respiratory status: spontaneous breathing, nonlabored ventilation and respiratory function stable Cardiovascular status: blood pressure returned to baseline and stable Postop Assessment: no signs of nausea or vomiting Anesthetic complications: no     Last Vitals:  Vitals:   03/03/18 1120 03/03/18 1249  BP: (!) 118/48 134/66  Pulse: 72 60  Resp: 16   Temp: 36.9 C   SpO2: 95% 95%    Last Pain:  Vitals:   03/03/18 1249  TempSrc:   PainSc: 4                  Chelsea Pedretti

## 2018-03-03 NOTE — Anesthesia Post-op Follow-up Note (Signed)
Anesthesia QCDR form completed.        

## 2018-03-03 NOTE — Anesthesia Preprocedure Evaluation (Signed)
Anesthesia Evaluation  Patient identified by MRN, date of birth, ID band Patient awake    Reviewed: Allergy & Precautions, NPO status , Patient's Chart, lab work & pertinent test results  History of Anesthesia Complications Negative for: history of anesthetic complications  Airway Mallampati: I  TM Distance: >3 FB Neck ROM: Full    Dental no notable dental hx.    Pulmonary neg sleep apnea, neg COPD, former smoker,    breath sounds clear to auscultation- rhonchi (-) wheezing      Cardiovascular Exercise Tolerance: Good (-) hypertension(-) CAD, (-) Past MI, (-) Cardiac Stents and (-) CABG  Rhythm:Regular Rate:Normal - Systolic murmurs and - Diastolic murmurs    Neuro/Psych  Headaches, PSYCHIATRIC DISORDERS Depression    GI/Hepatic Neg liver ROS, GERD  ,  Endo/Other  negative endocrine ROSneg diabetes  Renal/GU negative Renal ROS     Musculoskeletal negative musculoskeletal ROS (+)   Abdominal (+) + obese,   Peds  Hematology negative hematology ROS (+)   Anesthesia Other Findings Past Medical History: No date: Depression No date: GERD (gastroesophageal reflux disease)     Comment:  rare-tums prn No date: Headache     Comment:  h/o migraines   Reproductive/Obstetrics                             Anesthesia Physical Anesthesia Plan  ASA: II  Anesthesia Plan: General   Post-op Pain Management:    Induction: Intravenous  PONV Risk Score and Plan: 2 and Dexamethasone, Ondansetron and Midazolam  Airway Management Planned: Oral ETT  Additional Equipment:   Intra-op Plan:   Post-operative Plan: Extubation in OR  Informed Consent: I have reviewed the patients History and Physical, chart, labs and discussed the procedure including the risks, benefits and alternatives for the proposed anesthesia with the patient or authorized representative who has indicated his/her understanding and  acceptance.   Dental advisory given  Plan Discussed with: CRNA and Anesthesiologist  Anesthesia Plan Comments:         Anesthesia Quick Evaluation

## 2018-03-03 NOTE — Anesthesia Procedure Notes (Signed)
Procedure Name: Intubation Date/Time: 03/03/2018 7:42 AM Performed by: Dava NajjarFrazier, Kishawn Pickar, CRNA Pre-anesthesia Checklist: Patient identified, Emergency Drugs available, Suction available, Patient being monitored and Timeout performed Patient Re-evaluated:Patient Re-evaluated prior to induction Oxygen Delivery Method: Circle system utilized Preoxygenation: Pre-oxygenation with 100% oxygen Induction Type: IV induction Ventilation: Mask ventilation without difficulty Laryngoscope Size: Miller and 2 Grade View: Grade I Tube type: Oral Tube size: 7.5 mm Number of attempts: 1 Airway Equipment and Method: Stylet Placement Confirmation: ETT inserted through vocal cords under direct vision,  positive ETCO2 and breath sounds checked- equal and bilateral Secured at: 22 cm Tube secured with: Tape Dental Injury: Teeth and Oropharynx as per pre-operative assessment

## 2018-03-03 NOTE — Transfer of Care (Signed)
Immediate Anesthesia Transfer of Care Note  Patient: Norma Anderson  Procedure(s) Performed: LAPAROSCOPIC CHOLECYSTECTOMY (N/A )  Patient Location: PACU  Anesthesia Type:General  Level of Consciousness: awake, alert , oriented and patient cooperative  Airway & Oxygen Therapy: Patient Spontanous Breathing and Patient connected to face mask oxygen  Post-op Assessment: Report given to RN and Post -op Vital signs reviewed and stable  Post vital signs: Reviewed and stable  Last Vitals:  Vitals Value Taken Time  BP    Temp    Pulse 93 03/03/2018 10:03 AM  Resp 16 03/03/2018 10:03 AM  SpO2 96 % 03/03/2018 10:03 AM  Vitals shown include unvalidated device data.  Last Pain:  Vitals:   03/03/18 0608  TempSrc: Tympanic  PainSc: 2          Complications: No apparent anesthesia complications

## 2018-03-03 NOTE — Interval H&P Note (Signed)
History and Physical Interval Note:  03/03/2018 7:23 AM  Norma Anderson  has presented today for surgery, with the diagnosis of biliary colic  The various methods of treatment have been discussed with the patient and family. After consideration of risks, benefits and other options for treatment, the patient has consented to  Procedure(s): LAPAROSCOPIC CHOLECYSTECTOMY (N/A) as a surgical intervention .  The patient's history has been reviewed, patient examined, no change in status, stable for surgery.  I have reviewed the patient's chart and labs.  Questions were answered to the patient's satisfaction.     Ancil LinseyJason Evan Davis

## 2018-03-06 DIAGNOSIS — K811 Chronic cholecystitis: Secondary | ICD-10-CM

## 2018-03-06 LAB — SURGICAL PATHOLOGY

## 2018-03-13 ENCOUNTER — Encounter: Payer: Self-pay | Admitting: Surgery

## 2018-03-21 ENCOUNTER — Ambulatory Visit (INDEPENDENT_AMBULATORY_CARE_PROVIDER_SITE_OTHER): Payer: Managed Care, Other (non HMO) | Admitting: Surgery

## 2018-03-21 ENCOUNTER — Encounter: Payer: Self-pay | Admitting: Surgery

## 2018-03-21 VITALS — BP 140/83 | HR 70 | Temp 97.7°F | Ht 70.0 in | Wt 281.0 lb

## 2018-03-21 DIAGNOSIS — K811 Chronic cholecystitis: Secondary | ICD-10-CM

## 2018-03-21 NOTE — Progress Notes (Signed)
Surgical Clinic Progress/Follow-up Note   HPI:  52 y.o. Female presents to clinic for post-op follow-up 2 weeks s/p laparoscopic cholecystectomy Earlene Plater(Davis, 03/03/2018). Patient reports complete resolution of pre-operative pain, took narcotics only once, and has been tolerating regular diet (including pizza yesterday) with +flatus and normal BM's, denies N/V, fever/chills, CP, or SOB.  Review of Systems:  Constitutional: denies fever/chills  Respiratory: denies shortness of breath, wheezing  Cardiovascular: denies chest pain, palpitations  Gastrointestinal: abdominal pain, N/V, and bowel function as per interval history Skin: Denies any other rashes or skin discolorations except post-surgical wounds as per interval history  Vital Signs:  BP 140/83   Pulse 70   Temp 97.7 F (36.5 C) (Oral)   Ht 5\' 10"  (1.778 m)   Wt 281 lb (127.5 kg)   BMI 40.32 kg/m    Physical Exam:  Constitutional:  -- Obese body habitus  -- Awake, alert, and oriented x3  Pulmonary:  -- No crackles -- Equal breath sounds bilaterally -- Breathing non-labored at rest Cardiovascular:  -- S1, S2 present  -- No pericardial rubs  Gastrointestinal:  -- Soft and non-distended, non-tender to palpation, no guarding/rebound tenderness -- Post-surgical incisions all well-approximated without any peri-incisional erythema or drainage -- No abdominal masses appreciated, pulsatile or otherwise  Musculoskeletal / Integumentary:  -- Wounds or skin discoloration: None appreciated except post-surgical incisions as described above (gi) -- Extremities: B/L UE and LE FROM, hands and feet warm, no edema   Assessment:  52 y.o. yo Female with a problem list including...  Patient Active Problem List   Diagnosis Date Noted  . Chronic cholecystitis   . Cholelithiasis 02/09/2018  . Dysthymia 02/09/2018  . Migraines 02/09/2018  . Ovarian mass 02/08/2018  . Irregular menses 07/11/2012  . Obesity, unspecified 12/02/2011     presents to clinic for post-op follow-up evaluation, doing well 2 weeks s/p laparoscopic cholecystectomy Earlene Plater(Davis, 03/03/2018) for symptomatic cholelithiasis.  Plan:              - advance diet as tolerated              - okay to submerge incisions under water (baths, swimming) prn             - gradually resume all activities without restrictions over next 2 weeks             - apply sunblock particularly to incisions with sun exposure to reduce pigmentation of scars             - return to clinic as needed, instructed to call office if any questions or concerns  All of the above recommendations were discussed with the patient, and all of patient's questions were answered to her expressed satisfaction.  -- Scherrie GerlachJason E. Earlene Plateravis, MD, RPVI [place]: [facility] - Partnering for exceptional care. Office: [phone]

## 2018-03-21 NOTE — Patient Instructions (Signed)
Return as needed.The patient is aware to call back for any questions or concerns.  

## 2018-03-27 ENCOUNTER — Encounter: Payer: Self-pay | Admitting: Surgery

## 2019-10-25 ENCOUNTER — Ambulatory Visit: Payer: 59 | Attending: Internal Medicine

## 2019-10-25 DIAGNOSIS — Z23 Encounter for immunization: Secondary | ICD-10-CM

## 2019-10-25 NOTE — Progress Notes (Signed)
   Covid-19 Vaccination Clinic  Name:  Norma Anderson    MRN: [id number] DOB: [birth year]  10/25/2019  [name] was observed post Covid-19 immunization for 15 minutes without incident. She was provided with Vaccine Information Sheet and instruction to access the V-Safe system.   [name] was instructed to call 911 with any severe reactions post vaccine: Marland Kitchen Difficulty breathing  . Swelling of face and throat  . A fast heartbeat  . A bad rash all over body  . Dizziness and weakness   Immunizations Administered    Name Date Dose VIS Date Route   Moderna COVID-19 Vaccine 10/25/2019 12:20 PM 0.5 mL 07/03/2019 Intramuscular   Manufacturer: Moderna   Lot: [id number]   NDC: 76195-093-26

## 2019-11-27 ENCOUNTER — Ambulatory Visit: Payer: 59 | Attending: Internal Medicine

## 2019-11-27 DIAGNOSIS — Z23 Encounter for immunization: Secondary | ICD-10-CM

## 2019-11-27 NOTE — Progress Notes (Signed)
   Covid-19 Vaccination Clinic  Name:  Norma Anderson    MRN: [id number] DOB: 1966/04/06  11/27/2019  Norma Anderson was observed post Covid-19 immunization for 15 minutes without incident. She was provided with Vaccine Information Sheet and instruction to access the V-Safe system.   Norma Anderson was instructed to call 911 with any severe reactions post vaccine: Marland Kitchen Difficulty breathing  . Swelling of face and throat  . A fast heartbeat  . A bad rash all over body  . Dizziness and weakness   Immunizations Administered    Name Date Dose VIS Date Route   Moderna COVID-19 Vaccine 11/27/2019 11:36 AM 0.5 mL 07/2019 Intramuscular   Manufacturer: Moderna   Lot: [id number]   NDC: 89791-504-13

## 2020-01-22 IMAGING — CT CT ANGIO CHEST
2 of 6 series · 18 of 46 positions shown · IV contrast (APPLIED)
Comparison: Chest radiograph dated 01/23/2018

CLINICAL DATA: 52-year-old female with chest pain.

EXAM:
CT ANGIOGRAPHY CHEST WITH CONTRAST
TECHNIQUE: Multidetector CT imaging of the chest was performed using the
standard protocol during bolus administration of intravenous
contrast. Multiplanar CT image reconstructions and MIPs were
obtained to evaluate the vascular anatomy.
CONTRAST:  75mL OMNIPAQUE IOHEXOL 350 MG/ML SOLN

[Series 5: thins · axial · 0.66mm/px · z∈[-317,-73]mm · 15 of 268 slices shown]
[im 12/268  lung]
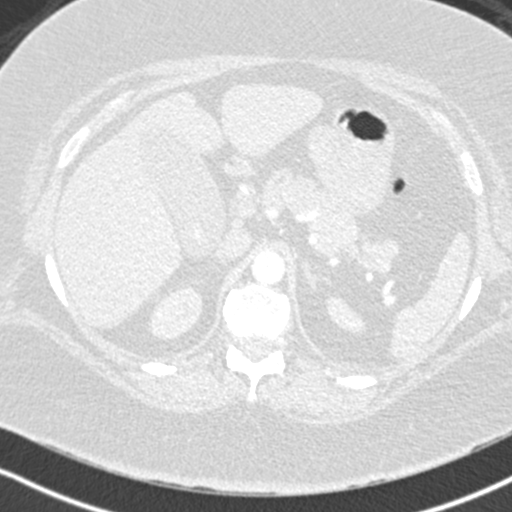
[im 35/268  soft-tissue]
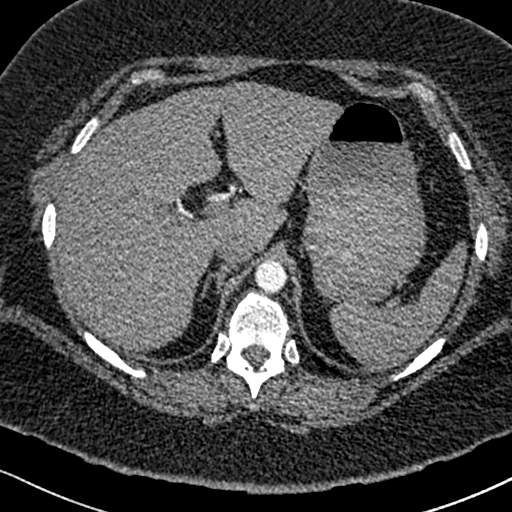
[im 47/268  lung]
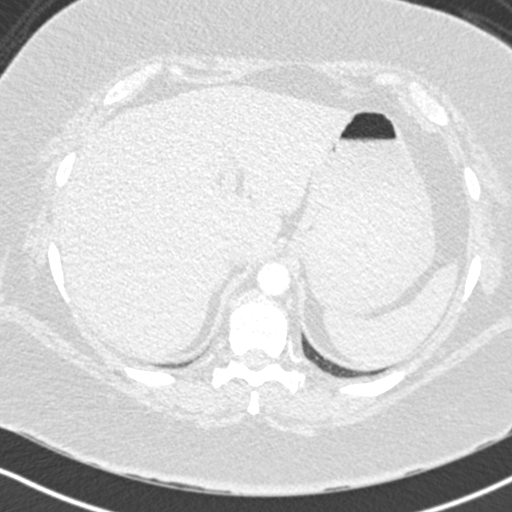
[im 70/268  soft-tissue]
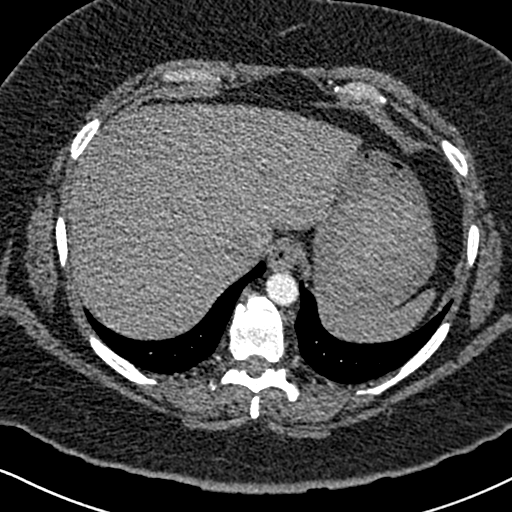
[im 82/268  lung]
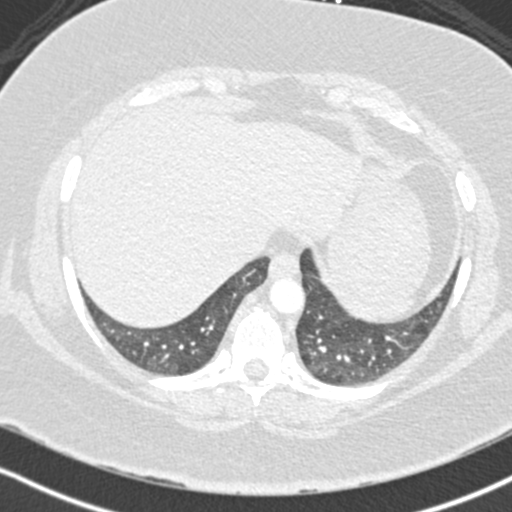
[im 105/268  soft-tissue]
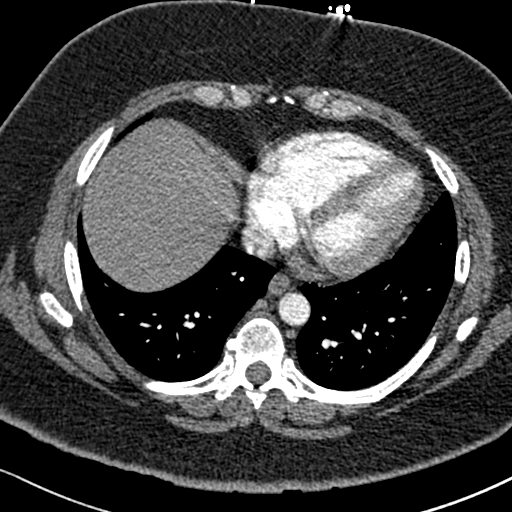
[im 117/268  lung]
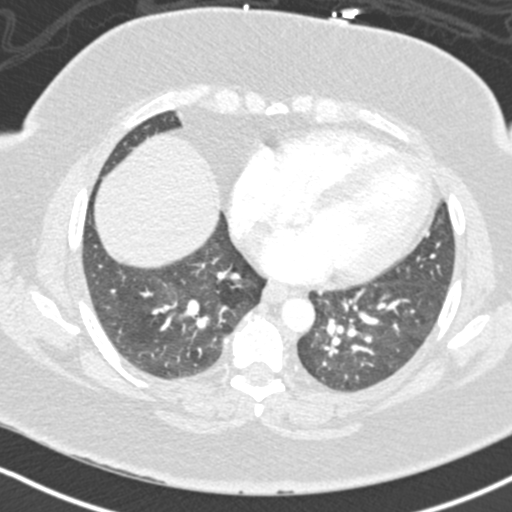
[im 140/268  soft-tissue]
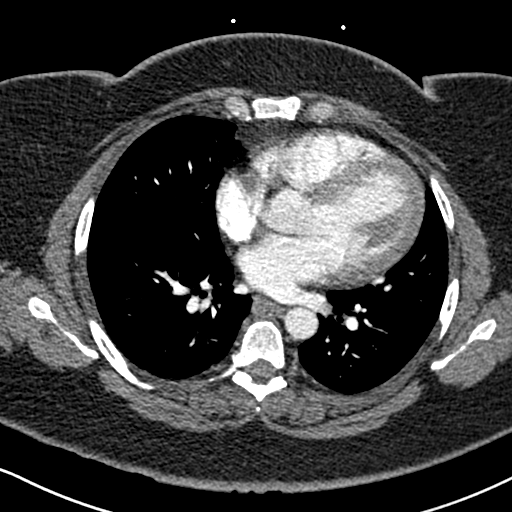
[im 151/268  lung]
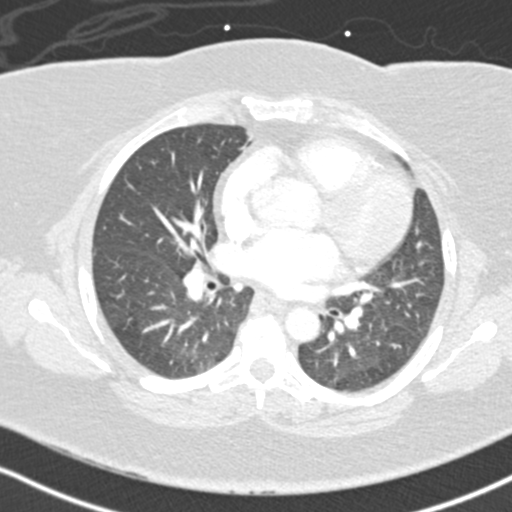
[im 163/268  soft-tissue]
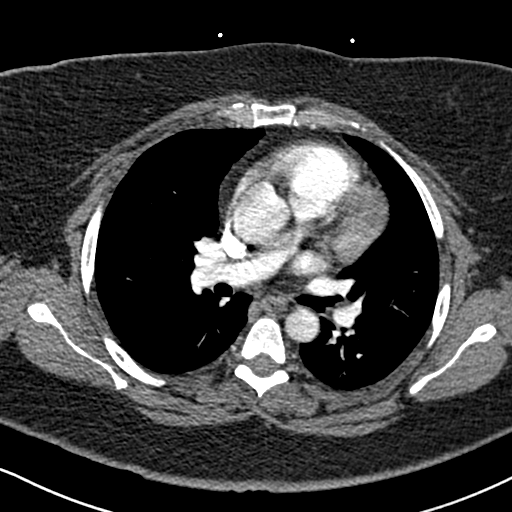
[im 186/268  lung]
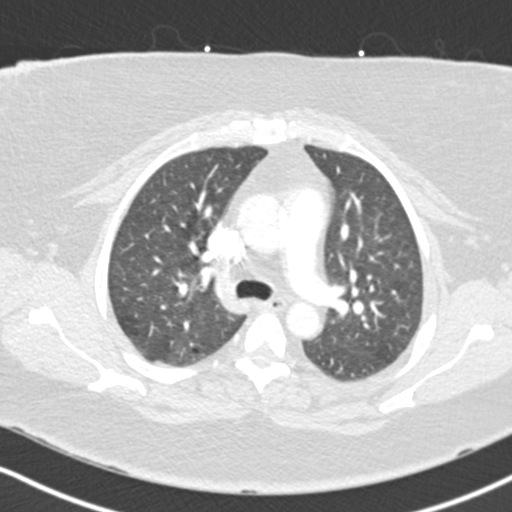
[im 198/268  soft-tissue]
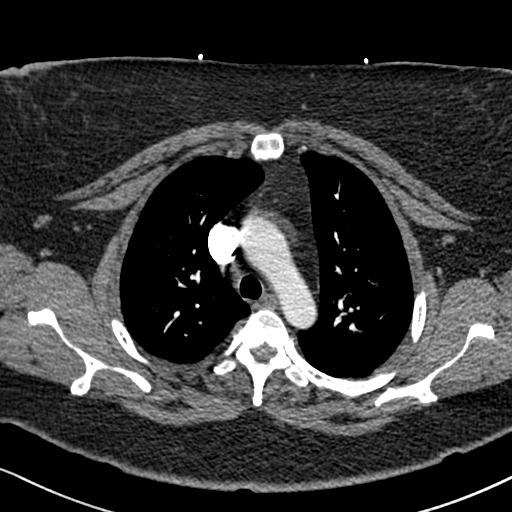
[im 221/268  lung]
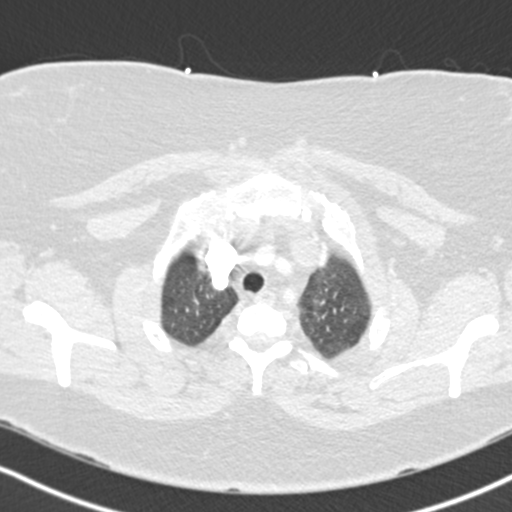
[im 233/268  soft-tissue]
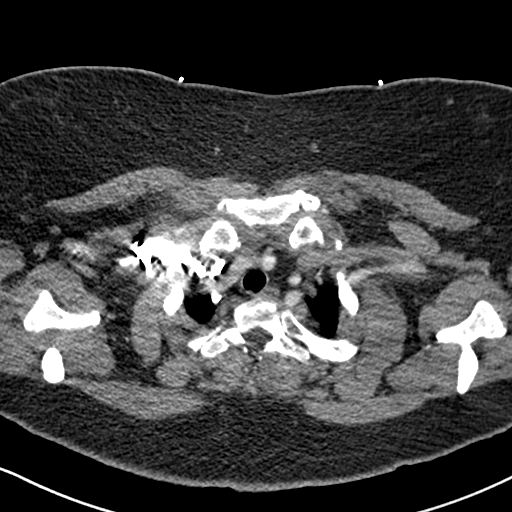
[im 256/268  lung]
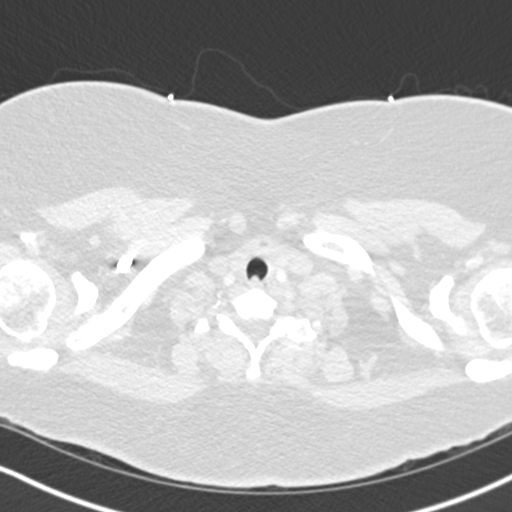

[Series 7: coronal mpr · coronal · 0.55mm/px · 3 of 100 slices shown]
[im 25/100  soft-tissue]
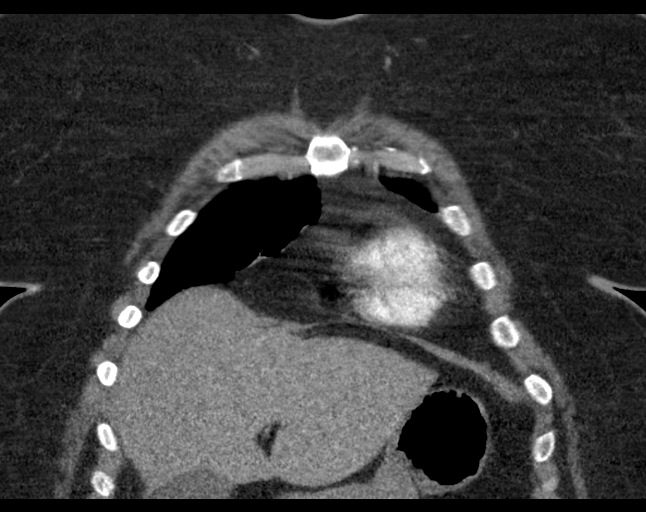
[im 50/100  soft-tissue]
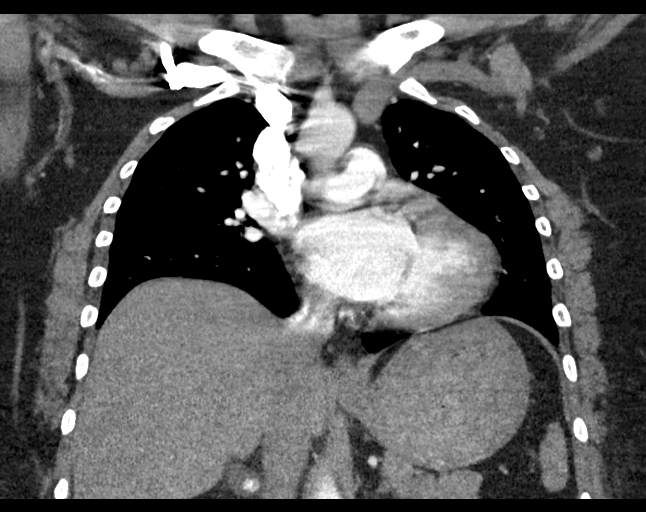
[im 75/100  soft-tissue]
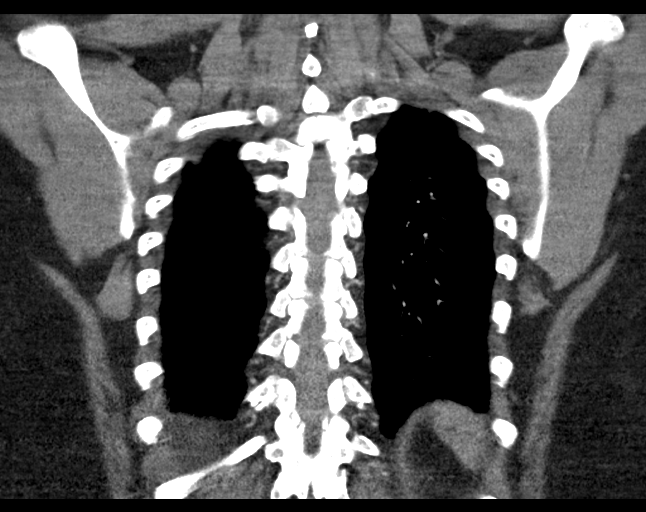

[18 of 46 positions shown; findings below may reference images not displayed]

FINDINGS: Cardiovascular: Top-normal cardiac size. No pericardial effusion.
The thoracic aorta is unremarkable. The origins of the great vessels
of the aortic arch are patent. No CT evidence of pulmonary embolism.

Mediastinum/Nodes: No hilar or mediastinal adenopathy. Esophagus is
grossly unremarkable. No mediastinal fluid collection.

Lungs/Pleura: Minimal heterogeneity of the lung, likely related to
areas of air trapping and may represent small airways versus small
vessel disease. No focal consolidation, pleural effusion, or
pneumothorax. The central airways are patent.

Upper Abdomen: Cholelithiasis. The visualized upper abdomen is
otherwise unremarkable.

Musculoskeletal: Sclerotic focus with no aggressive features in T7
vertebra, indeterminate. No acute osseous pathology.

Review of the MIP images confirms the above findings.
IMPRESSION: 1. No acute intrathoracic pathology. No CT evidence of pulmonary
embolism.
2. Cholelithiasis. Ultrasound may provide better evaluation of the
gallbladder if clinically indicated.
